# Patient Record
Sex: Male | Born: 2000 | Race: White | Hispanic: No | Marital: Single | Smoking: Never smoker
Health system: Southern US, Community
[De-identification: ages and names within clinical notes are randomized; demographics above are authoritative.]

---
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
## 2018-06-21 ENCOUNTER — Other Ambulatory Visit: Payer: Self-pay | Admitting: Internal Medicine

## 2018-06-21 ENCOUNTER — Other Ambulatory Visit: Payer: BLUE CROSS/BLUE SHIELD

## 2018-06-21 DIAGNOSIS — Z20822 Contact with and (suspected) exposure to covid-19: Secondary | ICD-10-CM

## 2018-06-21 DIAGNOSIS — R6889 Other general symptoms and signs: Secondary | ICD-10-CM | POA: Diagnosis not present

## 2018-06-25 LAB — NOVEL CORONAVIRUS, NAA: SARS-CoV-2, NAA: NOT DETECTED

## 2018-07-09 ENCOUNTER — Other Ambulatory Visit: Payer: BC Managed Care – PPO

## 2018-07-09 ENCOUNTER — Other Ambulatory Visit: Payer: Self-pay

## 2018-07-09 DIAGNOSIS — Z20822 Contact with and (suspected) exposure to covid-19: Secondary | ICD-10-CM

## 2018-07-09 DIAGNOSIS — R6889 Other general symptoms and signs: Secondary | ICD-10-CM | POA: Diagnosis not present

## 2018-07-12 LAB — NOVEL CORONAVIRUS, NAA: SARS-CoV-2, NAA: NOT DETECTED

## 2019-01-21 ENCOUNTER — Other Ambulatory Visit: Payer: Self-pay

## 2019-01-21 ENCOUNTER — Ambulatory Visit: Payer: BC Managed Care – PPO | Attending: Internal Medicine

## 2019-01-21 DIAGNOSIS — Z20822 Contact with and (suspected) exposure to covid-19: Secondary | ICD-10-CM

## 2019-01-22 LAB — NOVEL CORONAVIRUS, NAA: SARS-CoV-2, NAA: NOT DETECTED

## 2019-12-29 ENCOUNTER — Ambulatory Visit
Admission: EM | Admit: 2019-12-29 | Discharge: 2019-12-29 | Disposition: A | Discharge status: Home / Self Care | Payer: BC Managed Care – PPO | Attending: Internal Medicine | Admitting: Internal Medicine

## 2019-12-29 DIAGNOSIS — H6982 Other specified disorders of Eustachian tube, left ear: Secondary | ICD-10-CM | POA: Diagnosis not present

## 2019-12-29 MED ORDER — FLUTICASONE PROPIONATE 50 MCG/ACT NA SUSP: 1.0000 | Freq: Every day | NASAL | 0 refills | Status: AC

## 2019-12-29 MED ORDER — CETIRIZINE HCL 10 MG PO TABS: 10.0000 mg | ORAL_TABLET | Freq: Every day | ORAL | Status: AC

## 2019-12-29 MED ORDER — GUAIFENESIN ER 600 MG PO TB12: 600.0000 mg | ORAL_TABLET | Freq: Two times a day (BID) | ORAL | 0 refills | Status: AC

## 2019-12-29 NOTE — ED Provider Notes (Signed)
[facility]    CSN: [id number] Arrival date & time: 12/29/19  4496      History   Chief Complaint Chief Complaint  Patient presents with  . Otalgia    HPI Russell Reese is a 19 y.o. male comes to the urgent care with complaints of few days history of bilateral ear aches.  Symptoms started insidiously and has been persistent.  Patient denies any fever or chills.  Hearing is muffled in the left ear.  Initially the right ear was also muffled.  He has some postnasal dripping.  No sore throat.  No fever or chills.  He denies the room spinning around.  He also had a brief episode of ringing out of the left ear.  No sick contacts.  He is not vaccinated against COVID-19 virus.  No loss of taste or smell.   HPI  History reviewed. No pertinent past medical history.  Patient Active Problem List   Diagnosis Date Noted  . Acute URI 02/14/2016    Past Surgical History:  Procedure Laterality Date  . CLEFT LIP REPAIR  2000       Home Medications    Prior to Admission medications   Medication Sig Start Date End Date Taking? Authorizing Provider  cetirizine (ZYRTEC ALLERGY) 10 MG tablet Take 1 tablet (10 mg total) by mouth daily. 12/29/19  Yes Christy Ehrsam, Britta Mccreedy, MD  fluticasone (FLONASE) 50 MCG/ACT nasal spray Place 1 spray into both nostrils daily. 12/29/19  Yes Melania Kirks, Britta Mccreedy, MD  guaiFENesin (MUCINEX) 600 MG 12 hr tablet Take 1 tablet (600 mg total) by mouth 2 (two) times daily for 7 days. 12/29/19 01/05/20 Yes Armentha Branagan, Britta Mccreedy, MD    Family History Family History  Problem Relation Age of Onset  . Hypertension Father   . Cancer Neg Hx   . Diabetes Neg Hx   . Early death Neg Hx   . Heart disease Neg Hx   . Hyperlipidemia Neg Hx   . Kidney disease Neg Hx   . Stroke Neg Hx     Social History Social History   Tobacco Use  . Smoking status: Never Smoker  . Smokeless tobacco: Never Used  Substance Use Topics  . Alcohol use: No  . Drug use: No      Allergies   Patient has no known allergies.   Review of Systems Review of Systems  HENT: Positive for congestion and ear pain. Negative for dental problem, ear discharge, hearing loss and sore throat.   Respiratory: Negative for chest tightness and shortness of breath.   Cardiovascular: Negative for chest pain.  Gastrointestinal: Negative for nausea and vomiting.     Physical Exam Triage Vital Signs ED Triage Vitals  Enc Vitals Group     BP 12/29/19 0950 (!) 144/73     Pulse Rate 12/29/19 0950 (!) 53     Resp 12/29/19 0950 16     Temp 12/29/19 0950 98.1 F (36.7 C)     Temp src --      SpO2 12/29/19 0950 98 %     Weight --      Height --      Head Circumference --      Peak Flow --      Pain Score 12/29/19 0955 5     Pain Loc --      Pain Edu? --      Excl. in GC? --    No data found.  Updated Vital Signs BP Marland Kitchen)  144/73   Pulse (!) 53   Temp 98.1 F (36.7 C)   Resp 16   SpO2 98%   Visual Acuity Right Eye Distance:   Left Eye Distance:   Bilateral Distance:    Right Eye Near:   Left Eye Near:    Bilateral Near:     Physical Exam Vitals and nursing note reviewed.  HENT:     Right Ear: Tympanic membrane normal.     Ears:     Comments: Middle ear effusion.  Tympanic membrane is without erythema.    Nose: Nose normal.  Cardiovascular:     Rate and Rhythm: Normal rate and regular rhythm.     Pulses: Normal pulses.     Heart sounds: Normal heart sounds.  Neurological:     Mental Status: He is alert.      UC Treatments / Results  Labs (all labs ordered are listed, but only abnormal results are displayed) Labs Reviewed - No data to display  EKG   Radiology No results found.  Procedures Procedures (including critical care time)  Medications Ordered in UC Medications - No data to display  Initial Impression / Assessment and Plan / UC Course  I have reviewed the triage vital signs and the nursing notes.  Pertinent labs & imaging  results that were available during my care of the patient were reviewed by me and considered in my medical decision making (see chart for details).     1.  Eustachian tube dysfunction-left: Fluticasone nasal spray Zyrtec daily Mucinex 600 mg twice daily If patient develops worsening pain, persistent dizziness, fever or chills he is advised to return to the urgent care to be reevaluated. Final Clinical Impressions(s) / UC Diagnoses   Final diagnoses:  Eustachian tube dysfunction, left     Discharge Instructions     Use medications as prescribed Saline nasal spray as needed will be helpful If you develop worsening symptoms please return to urgent care to be reevaluated.   ED Prescriptions    Medication Sig Dispense Auth. Provider   fluticasone (FLONASE) 50 MCG/ACT nasal spray Place 1 spray into both nostrils daily. 16 g Merrilee Jansky, MD   cetirizine (ZYRTEC ALLERGY) 10 MG tablet Take 1 tablet (10 mg total) by mouth daily.  Merrilee Jansky, MD   guaiFENesin (MUCINEX) 600 MG 12 hr tablet Take 1 tablet (600 mg total) by mouth 2 (two) times daily for 7 days. 14 tablet Trestin Vences, Britta Mccreedy, MD     PDMP not reviewed this encounter.   Merrilee Jansky, MD 12/29/19 [phone]

## 2019-12-29 NOTE — ED Triage Notes (Signed)
Pt presents with c/o bilateral ear pain for past 4 days

## 2019-12-29 NOTE — Discharge Instructions (Addendum)
Use medications as prescribed Saline nasal spray as needed will be helpful If you develop worsening symptoms please return to urgent care to be reevaluated.

## 2021-02-22 ENCOUNTER — Emergency Department (HOSPITAL_BASED_OUTPATIENT_CLINIC_OR_DEPARTMENT_OTHER)
Admission: EM | Admit: 2021-02-22 | Discharge: 2021-02-22 | Disposition: A | Discharge status: Home / Self Care | Payer: BC Managed Care – PPO | Attending: Emergency Medicine | Admitting: Emergency Medicine

## 2021-02-22 ENCOUNTER — Other Ambulatory Visit: Payer: Self-pay

## 2021-02-22 ENCOUNTER — Emergency Department (HOSPITAL_BASED_OUTPATIENT_CLINIC_OR_DEPARTMENT_OTHER): Payer: BC Managed Care – PPO | Admitting: Radiology

## 2021-02-22 ENCOUNTER — Encounter (HOSPITAL_BASED_OUTPATIENT_CLINIC_OR_DEPARTMENT_OTHER): Payer: Self-pay

## 2021-02-22 DIAGNOSIS — W268XXA Contact with other sharp object(s), not elsewhere classified, initial encounter: Secondary | ICD-10-CM | POA: Insufficient documentation

## 2021-02-22 DIAGNOSIS — S61319A Laceration without foreign body of unspecified finger with damage to nail, initial encounter: Secondary | ICD-10-CM

## 2021-02-22 DIAGNOSIS — S61310A Laceration without foreign body of right index finger with damage to nail, initial encounter: Secondary | ICD-10-CM | POA: Insufficient documentation

## 2021-02-22 DIAGNOSIS — Z23 Encounter for immunization: Secondary | ICD-10-CM | POA: Diagnosis not present

## 2021-02-22 DIAGNOSIS — S6991XA Unspecified injury of right wrist, hand and finger(s), initial encounter: Secondary | ICD-10-CM | POA: Diagnosis present

## 2021-02-22 MED ORDER — LIDOCAINE HCL (PF) 1 % IJ SOLN
10.0000 mL | Freq: Once | INTRAMUSCULAR | Status: AC
  Administered 2021-02-22: 10 mL
  Filled 2021-02-22: qty 10

## 2021-02-22 MED ORDER — TETANUS-DIPHTHERIA TOXOIDS TD 5-2 LFU IM INJ
0.5000 mL | INJECTION | Freq: Once | INTRAMUSCULAR | Status: AC
Start: 2021-02-22 — End: 2021-02-22
  Administered 2021-02-22: 0.5 mL via INTRAMUSCULAR
  Filled 2021-02-22: qty 0.5

## 2021-02-22 NOTE — ED Provider Notes (Signed)
[facility] Provider Note   CSN: [id number] Arrival date & time: 02/22/21  1034     History  Chief Complaint  Patient presents with   Finger Injury    Russell Reese is a 21 y.o. male.  21 year old male presents today for evaluation of injury to right index finger.  Patient states he was working around a friend's house today where they were digging a trench.  He states one of the area had a cinderblock which he was hammering and he is unsure how but somehow injured his finger on a metal rod.  Denies other injury.  Without active bleeding.  Last tetanus shot around 10 years ago.  Current pain level 2/10.  The history is provided by the patient. No language interpreter was used.      Home Medications Prior to Admission medications   Medication Sig Start Date End Date Taking? Authorizing Provider  cetirizine (ZYRTEC ALLERGY) 10 MG tablet Take 1 tablet (10 mg total) by mouth daily. Patient not taking: Reported on 02/22/2021 12/29/19   Chase Picket, MD  fluticasone [facility]) 50 MCG/ACT nasal spray Place 1 spray into both nostrils daily. Patient not taking: Reported on 02/22/2021 12/29/19   Chase Picket, MD      Allergies    Patient has no known allergies.    Review of Systems   Review of Systems  Skin:  Positive for wound.  Neurological:  Negative for weakness and numbness.  All other systems reviewed and are negative.  Physical Exam Updated Vital Signs BP 127/74    Pulse 80    Temp 98.3 F (36.8 C) (Oral)    Resp 18    Ht 5\' 10"  (1.778 m)    Wt 86.2 kg    SpO2 100%    BMI 27.26 kg/m  Physical Exam Vitals and nursing note reviewed.  Constitutional:      General: He is not in acute distress.    Appearance: Normal appearance. He is not ill-appearing.  HENT:     Head: Normocephalic and atraumatic.     Nose: Nose normal.  Eyes:     Conjunctiva/sclera: Conjunctivae normal.  Pulmonary:     Effort: Pulmonary effort is normal. No  respiratory distress.  Musculoskeletal:        General: No deformity.     Comments: Right index finger with minimal damage to right fingernail.  Without other obvious injury or deformity to right index finger.  Otherwise right hand without injury.  Full range of motion in right hand including full extension and flexion in the right index finger.  2+ radial and ulnar pulse present.  Sensation intact in all digits of the right hand.  Skin:    Findings: No rash.  Neurological:     Mental Status: He is alert.    ED Results / Procedures / Treatments   Labs (all labs ordered are listed, but only abnormal results are displayed) Labs Reviewed - No data to display  EKG None  Radiology No results found.  Procedures .Nail Removal  Date/Time: 02/22/2021 1:28 PM Performed by: Evlyn Courier, PA-C Authorized by: Evlyn Courier, PA-C   Consent:    Consent obtained:  Verbal   Consent given by:  Patient   Risks discussed:  Incomplete removal, pain and permanent nail deformity   Alternatives discussed:  Delayed treatment and alternative treatment Universal protocol:    Procedure explained and questions answered to patient or proxy's satisfaction: yes     Relevant documents  present and verified: yes     Imaging studies available: yes     Patient identity confirmed:  Verbally with patient and arm band Location:    Hand:  R index finger Pre-procedure details:    Skin preparation:  Povidone-iodine Anesthesia:    Anesthesia method:  Local infiltration   Local anesthetic:  Lidocaine 1% w/o epi Nail Removal:    Nail removed:  Partial   Nail side:  Lateral   Nail bed repaired: no   Trephination:    Subungual hematoma drained: no   Post-procedure details:    Dressing: Nonadhesive dressing.   Procedure completion:  Tolerated well, no immediate complications    Medications Ordered in ED Medications - No data to display  ED Course/ Medical Decision Making/ A&P Clinical Course as of 02/22/21 1245   Tue Feb 22, 2021  1237 DG Finger Index Right [AA]    Clinical Course User Index [AA] Evlyn Courier, PA-C                           Medical Decision Making Amount and/or Complexity of Data Reviewed Radiology: ordered.   Medical Decision Making / ED Course   This patient presents to the ED for concern of right finger injury, this involves an extensive number of treatment options, and is a complaint that carries with it a high risk of complications and morbidity.  The differential diagnosis includes distal right index finger fracture, nailbed injury, laceration  MDM: 21 year old male presents today for evaluation following a crush injury to right index finger.  Full range of motion.  Pain currently 2/10.  Neurovascularly intact.  X-ray without evidence of fracture or foreign body.  Partially lacerated portion of fingernail removed.  Tetanus updated.  Lab Tests: -I ordered, reviewed, and interpreted labs.   The pertinent results include:   Labs Reviewed - No data to display    EKG  EKG Interpretation  Date/Time:    Ventricular Rate:    PR Interval:    QRS Duration:   QT Interval:    QTC Calculation:   R Axis:     Text Interpretation:           Imaging Studies ordered: I ordered imaging studies including right hand x-ray I independently visualized and interpreted imaging. I agree with the radiologist interpretation   Medicines ordered and prescription drug management: Meds ordered this encounter  Medications   tetanus & diphtheria toxoids (adult) (TENIVAC) injection 0.5 mL   lidocaine (PF) (XYLOCAINE) 1 % injection 10 mL    -I have reviewed the patients home medicines and have made adjustments as needed  Social Determinants of Health:  Factors impacting patients care include: Does not have PCP.  Return precautions discussed of when to return to the emergency room for evaluation.   Reevaluation: After the interventions noted above, I reevaluated the patient and  found that they have :improved  Co morbidities that complicate the patient evaluation History reviewed. No pertinent past medical history.    Dispostion: Patient appropriate for discharge.  Return precautions discussed.  Patient voices understanding and is in agreement with plan.  Final Clinical Impression(s) / ED Diagnoses Final diagnoses:  Laceration of nail bed of finger, initial encounter    Rx / DC Orders ED Discharge Orders     None         Evlyn Courier, PA-C 02/22/21 1332    Regan Lemming, MD 02/22/21 [phone]

## 2021-02-22 NOTE — ED Triage Notes (Signed)
Smashed rt. Index finger, ? Foreign body Bleeding controlled will need tetanus

## 2021-02-22 NOTE — Discharge Instructions (Signed)
Your x-ray did not show any evidence of fracture.  Portion of your nail was removed.  Keep this area covered and protected until it heals.  If you notice any signs of infection including swelling, drainage, or worsening pain you can return to the emergency room for evaluation.

## 2022-09-27 IMAGING — DX DG FINGER INDEX 2+V*R*
3 series · 3 of 3 positions shown · non-contrast
Comparison: None.

CLINICAL DATA: Smashed finger with a hammer.

EXAM:
RIGHT INDEX FINGER 2+V

[finger ap]
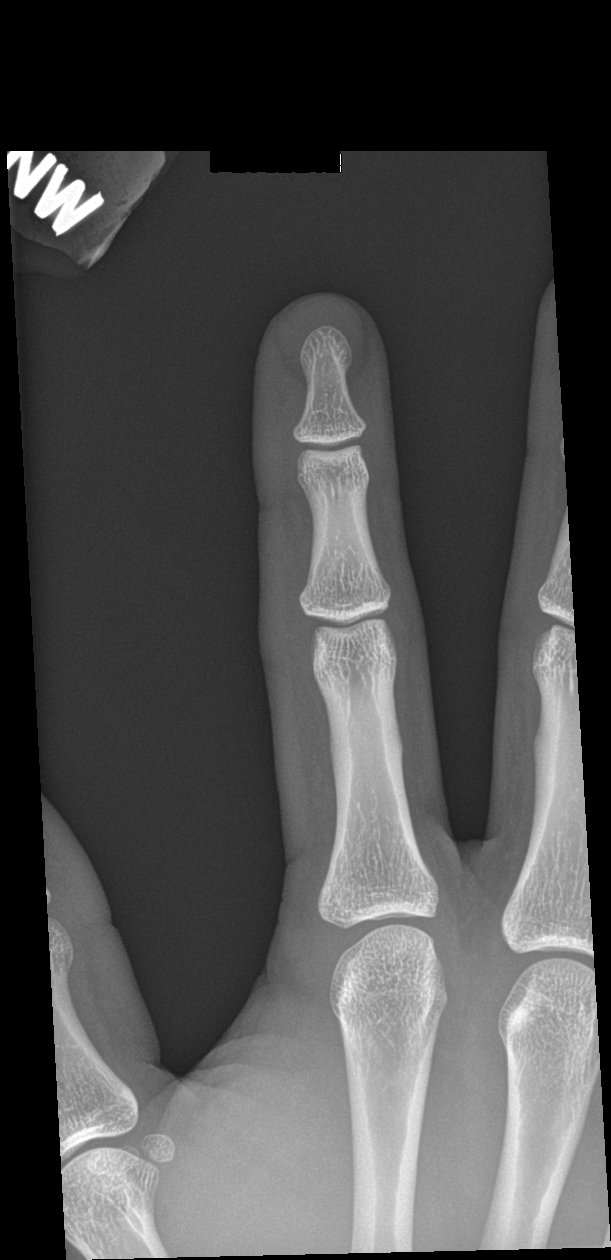

[finger obl]
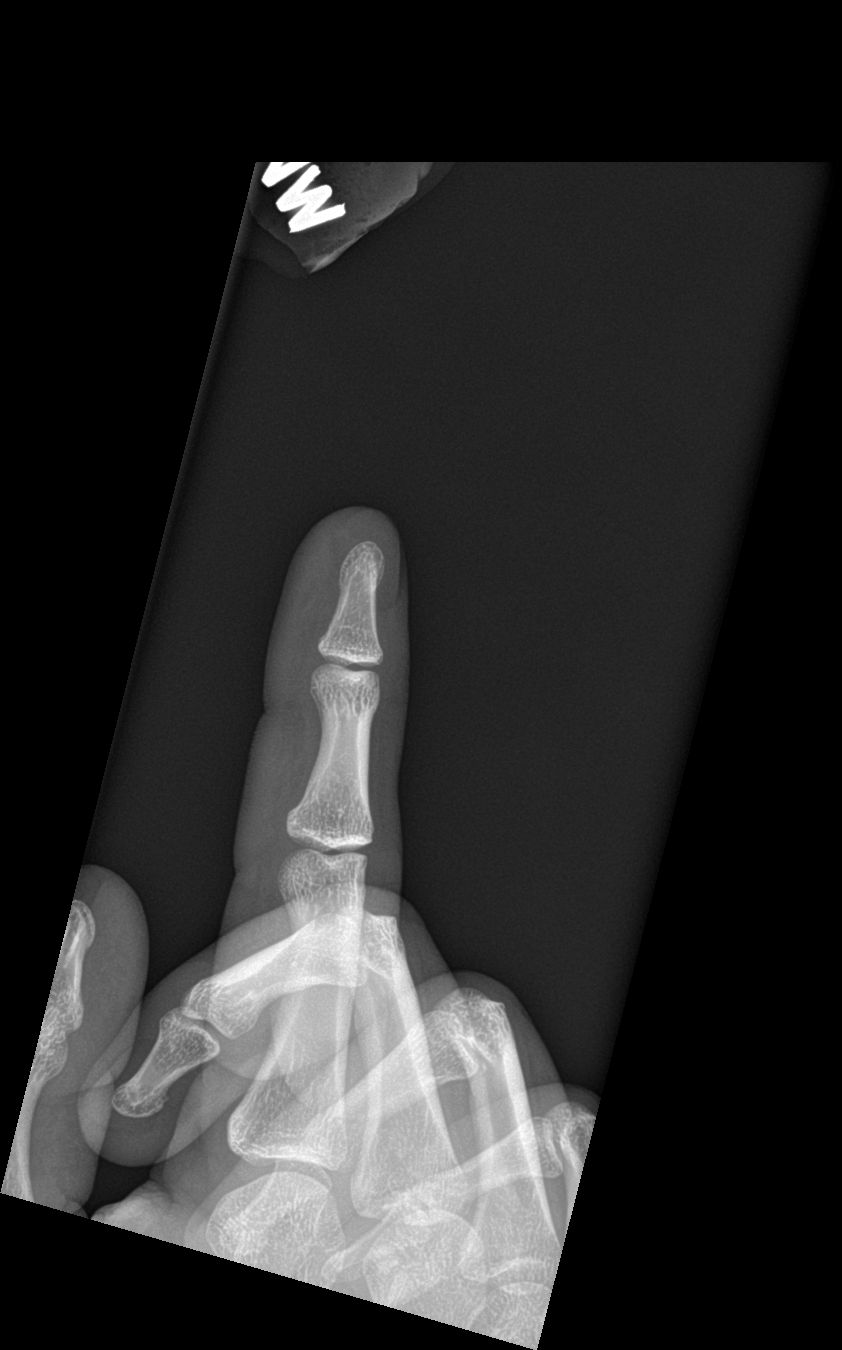

[finger lat]
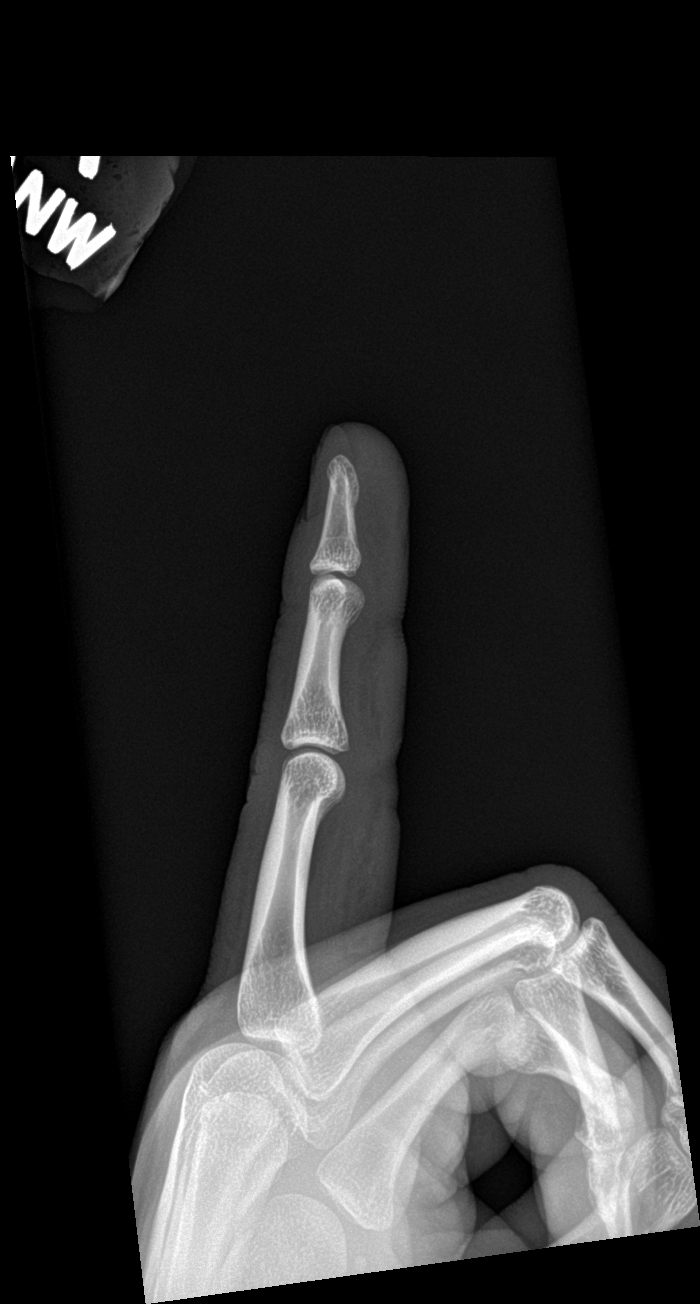

[3 of 3 positions shown; findings below may reference images not displayed]

FINDINGS: No acute fracture or dislocation. No aggressive osseous lesion.
Normal alignment.

Soft tissue are unremarkable. No radiopaque foreign body or soft
tissue emphysema.
IMPRESSION: 1.  No acute osseous injury of the right second digit.
